# Patient Record
Sex: Female | Born: 1995 | Race: White | Hispanic: No | Marital: Single
Health system: Southern US, Community
[De-identification: ages and names within clinical notes are randomized; demographics above are authoritative.]

---
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
## 2015-04-27 IMAGING — CR DG THORACOLUMBAR SPINE STANDING SCOLIOSIS
1 series · 3 of 3 positions shown · non-contrast
Comparison: None.

CLINICAL DATA: Scoliosis

EXAM:
THORACOLUMBAR SCOLIOSIS STUDY - STANDING VIEWS

[Series 1001: view not recorded · 0.40mm/px · 3 of 3 slices shown]
[im 1/3]
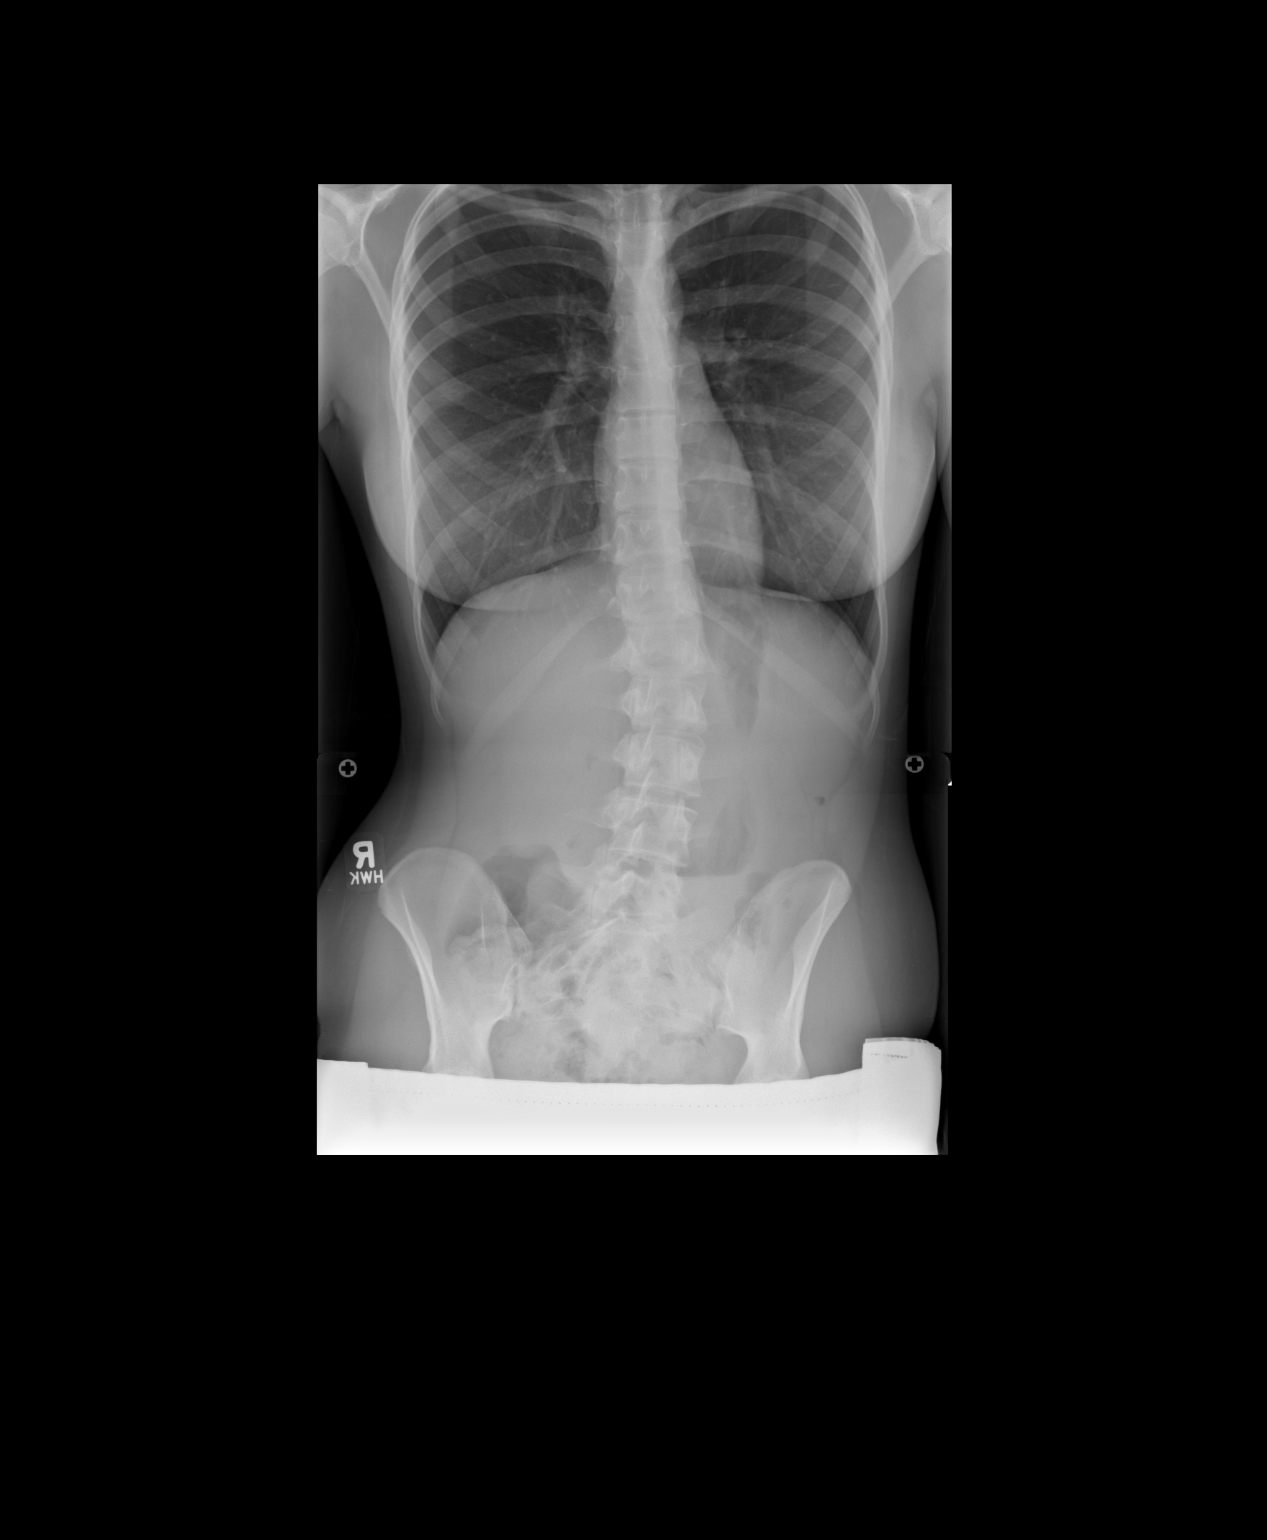
[im 2/3]
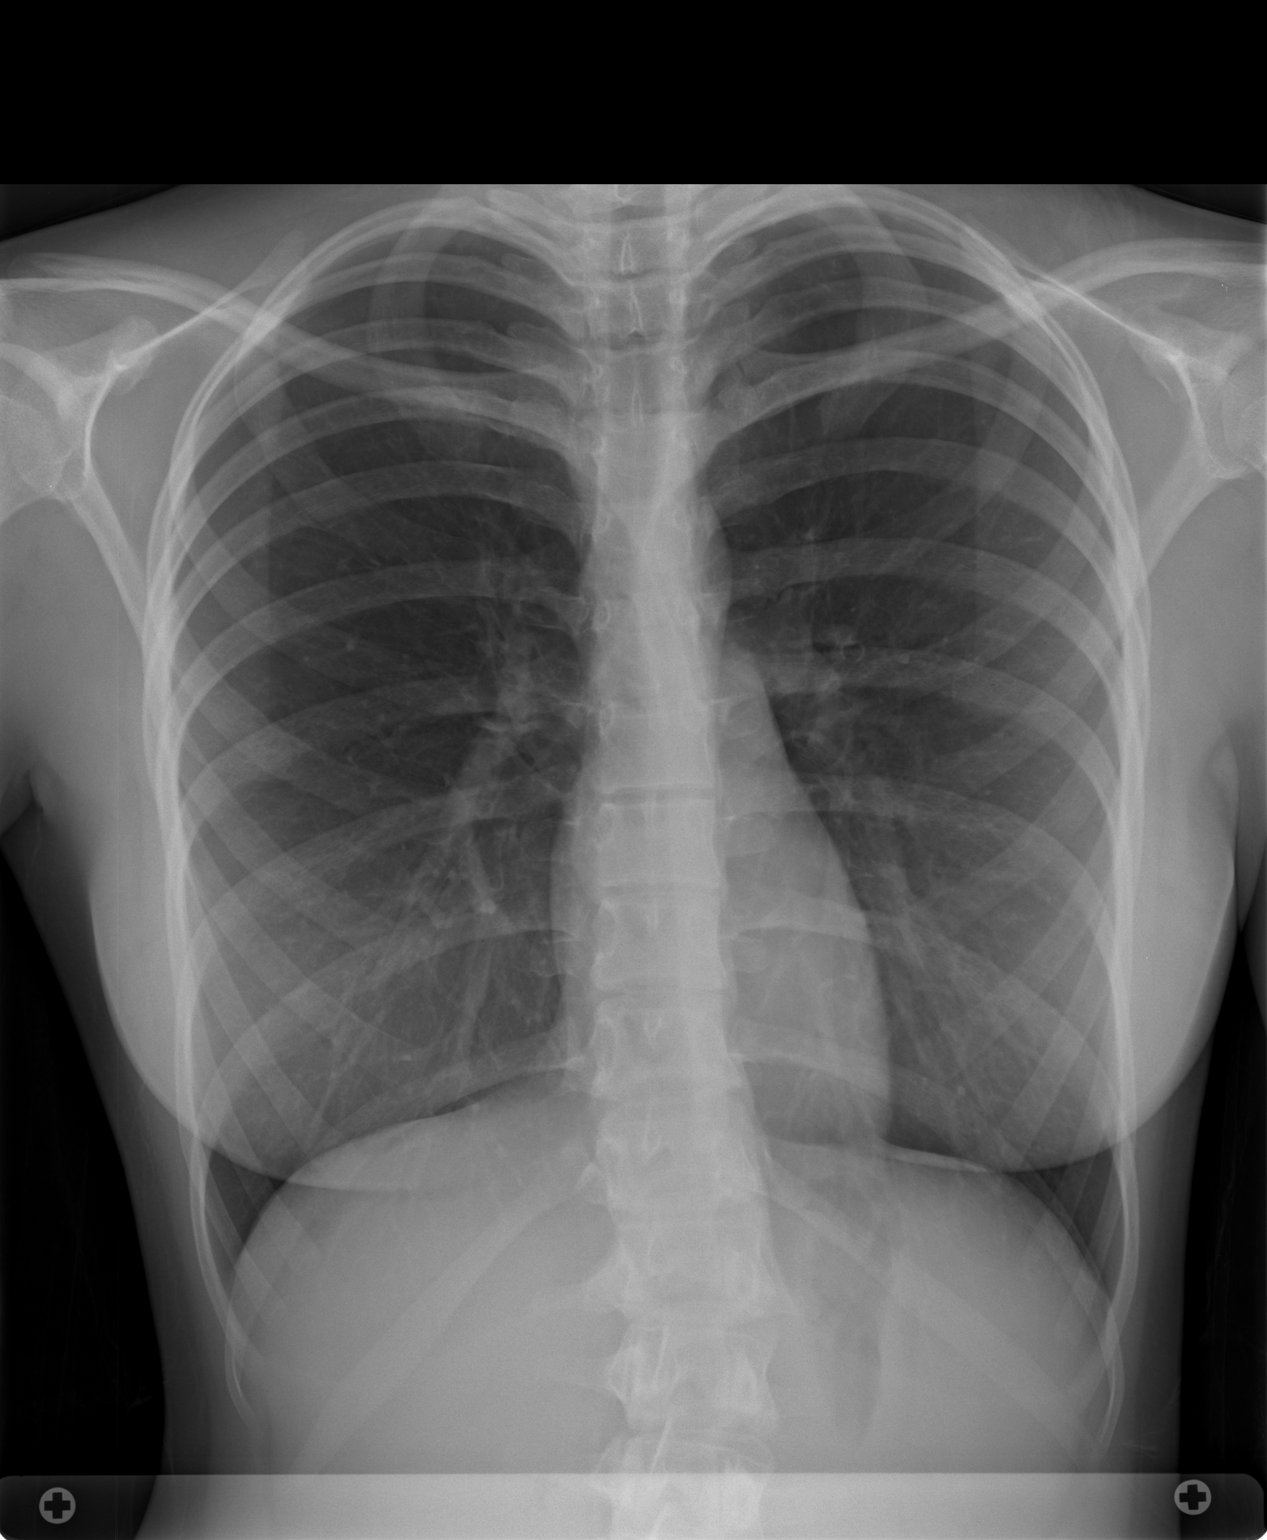
[im 3/3]
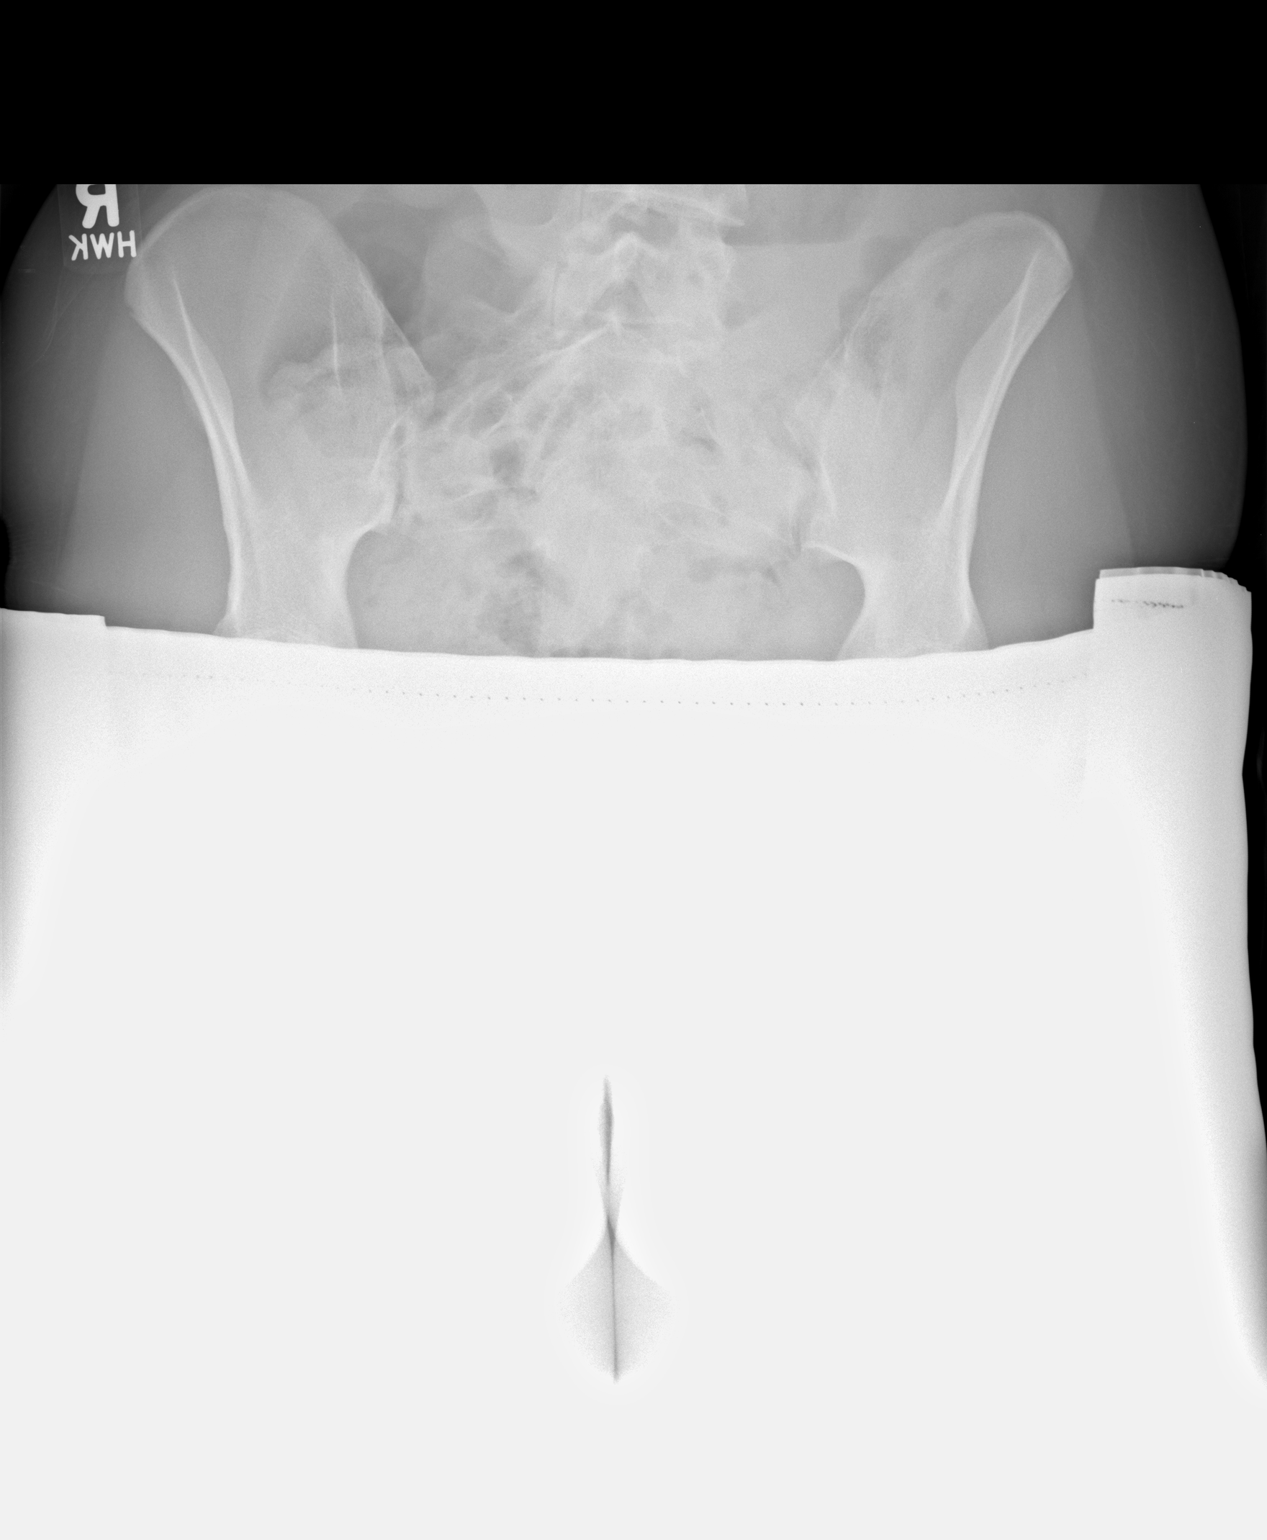

[3 of 3 positions shown; findings below may reference images not displayed]

FINDINGS: There is a lumbar scoliosis present convex to the left by
approximately 16 degrees. The thoracic vertebrae appear to be in
normal alignment.

The lungs are clear. The heart is within normal limits in size. The
bowel gas pattern is nonspecific.
IMPRESSION: Mild lumbar scoliosis convex left by 16 degrees.

## 2017-06-20 ENCOUNTER — Ambulatory Visit (INDEPENDENT_AMBULATORY_CARE_PROVIDER_SITE_OTHER): Payer: 59

## 2017-06-20 ENCOUNTER — Encounter (INDEPENDENT_AMBULATORY_CARE_PROVIDER_SITE_OTHER): Payer: Self-pay | Admitting: Orthopedic Surgery

## 2017-06-20 ENCOUNTER — Ambulatory Visit (INDEPENDENT_AMBULATORY_CARE_PROVIDER_SITE_OTHER): Payer: 59 | Admitting: Orthopedic Surgery

## 2017-06-20 DIAGNOSIS — M25561 Pain in right knee: Secondary | ICD-10-CM

## 2017-06-20 DIAGNOSIS — G8929 Other chronic pain: Secondary | ICD-10-CM | POA: Diagnosis not present

## 2017-06-20 DIAGNOSIS — M25562 Pain in left knee: Secondary | ICD-10-CM

## 2017-06-22 ENCOUNTER — Encounter (INDEPENDENT_AMBULATORY_CARE_PROVIDER_SITE_OTHER): Payer: Self-pay | Admitting: Orthopedic Surgery

## 2017-06-22 NOTE — Progress Notes (Signed)
Office Visit Note   Patient: Tara Christian           Date of Birth: 1996-01-22           MRN: [id number] Visit Date: 06/20/2017 Requested by: No referring provider defined for this encounter. PCP: Patient, No Pcp Per  Subjective: Chief Complaint  Patient presents with  . Right Knee - Pain  . Left Knee - Pain    HPI: Tara Christian is a patient with bilateral knee pain left worse than right.'s been going on for 3-4 years but it has been worse a fall in the kitchen at work 1 month ago.  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  Reports swelling popping but no locking or catching.  Localizes the pain primarily to the anterior aspect of the knee.  She does use a brace on the left-hand side.  She has been using Motrin and Aleve.  Stairs can be tough.  She has not done any rehab or exercises.              ROS: All systems reviewed are negative as they relate to the chief complaint within the history of present illness.  Patient denies  fevers or chills.   Assessment & Plan: Visit Diagnoses:  1. Chronic pain of both knees     Plan: Impression is chronic anterior knee pain and the patient has pretty normal exam normal alignment and normal radiographs.  I think she may have something aggravating that left knee and right knee in the retropatellar region but it is nothing surgical.  I would go with quad strengthening exercises and cortisone injections as needed if her symptoms become more severe.  Would not scan the knee until she is failed cortisone injections.  Follow-Up Instructions: Return if symptoms worsen or fail to improve.   Orders:  Orders Placed This Encounter  Procedures  . XR KNEE 3 VIEW RIGHT  . XR KNEE 3 VIEW LEFT   No orders of the defined types were placed in this encounter.     Procedures: No procedures performed   Clinical Data: No additional findings.  Objective: Vital Signs: There were no vitals taken for this visit.  Physical Exam:    Constitutional: Patient appears well-developed HEENT:  Head: Normocephalic Eyes:EOM are normal Neck: Normal range of motion Cardiovascular: Normal rate Pulmonary/chest: Effort normal Neurologic: Patient is alert Skin: Skin is warm Psychiatric: Patient has normal mood and affect    Ortho Exam: Orthopedic exam demonstrates full active and passive range of motion of both knees.  Negative patellar apprehension.  No increase in Q angle.  Has a little bit of periretinacular tenderness but no discrete tenderness at the inferior pole of the patella or the distal aspect of the quad tendon.  Collateral and cruciate ligaments are stable bilaterally.  No real patellofemoral crepitus with range of motion.  Negative apprehension.  No other masses lymphadenopathy or skin changes noted in the bilateral knee region.  Specialty Comments:  No specialty comments available.  Imaging: No results found.   PMFS History: There are no active problems to display for this patient.  History reviewed. No pertinent past medical history.  History reviewed. No pertinent family history.  History reviewed. No pertinent surgical history. Social History   Occupational History  . Not on file  Tobacco Use  . Smoking status: Not on file  Substance and Sexual Activity  . Alcohol use: Not on file  . Drug use: Not on  file  . Sexual activity: Not on file

## 2018-12-08 ENCOUNTER — Emergency Department (HOSPITAL_COMMUNITY): Payer: BC Managed Care – PPO

## 2018-12-08 ENCOUNTER — Emergency Department (HOSPITAL_COMMUNITY)
Admission: EM | Admit: 2018-12-08 | Discharge: 2018-12-08 | Disposition: A | Discharge status: Home / Self Care | Payer: BC Managed Care – PPO | Attending: Emergency Medicine | Admitting: Emergency Medicine

## 2018-12-08 ENCOUNTER — Encounter (HOSPITAL_COMMUNITY): Payer: Self-pay | Admitting: Emergency Medicine

## 2018-12-08 ENCOUNTER — Other Ambulatory Visit: Payer: Self-pay

## 2018-12-08 DIAGNOSIS — S61512A Laceration without foreign body of left wrist, initial encounter: Secondary | ICD-10-CM | POA: Insufficient documentation

## 2018-12-08 DIAGNOSIS — Y929 Unspecified place or not applicable: Secondary | ICD-10-CM | POA: Diagnosis not present

## 2018-12-08 DIAGNOSIS — W010XXA Fall on same level from slipping, tripping and stumbling without subsequent striking against object, initial encounter: Secondary | ICD-10-CM | POA: Insufficient documentation

## 2018-12-08 DIAGNOSIS — Z23 Encounter for immunization: Secondary | ICD-10-CM | POA: Insufficient documentation

## 2018-12-08 DIAGNOSIS — Y999 Unspecified external cause status: Secondary | ICD-10-CM | POA: Insufficient documentation

## 2018-12-08 DIAGNOSIS — S80211A Abrasion, right knee, initial encounter: Secondary | ICD-10-CM | POA: Diagnosis not present

## 2018-12-08 DIAGNOSIS — Y939 Activity, unspecified: Secondary | ICD-10-CM | POA: Insufficient documentation

## 2018-12-08 MED ORDER — LIDOCAINE-EPINEPHRINE (PF) 2 %-1:200000 IJ SOLN
10.0000 mL | Freq: Once | INTRAMUSCULAR | Status: AC
  Administered 2018-12-08: 10 mL
  Filled 2018-12-08: qty 20

## 2018-12-08 MED ORDER — TETANUS-DIPHTH-ACELL PERTUSSIS 5-2.5-18.5 LF-MCG/0.5 IM SUSP
0.5000 mL | Freq: Once | INTRAMUSCULAR | Status: AC
  Administered 2018-12-08: 0.5 mL via INTRAMUSCULAR
  Filled 2018-12-08: qty 0.5

## 2018-12-08 MED ORDER — OXYCODONE HCL 5 MG PO TABS
10.0000 mg | ORAL_TABLET | Freq: Once | ORAL | Status: AC
  Administered 2018-12-08: 07:00:00 10 mg via ORAL
  Filled 2018-12-08: qty 2

## 2018-12-08 MED ORDER — LIDOCAINE-EPINEPHRINE 2 %-1:100000 IJ SOLN: 20.0000 mL | Freq: Once | INTRAMUSCULAR | Status: DC

## 2018-12-08 NOTE — ED Notes (Signed)
Wound cleaned and redressed by Tonny Bollman and Judson Roch, EMT
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
## 2018-12-08 NOTE — ED Notes (Signed)
Pt crying and upset about the pain in her knee. Offered to xray knee but pt declined and states that she does not need an xray. Two abrasions noted to right knee, to abrasions cleaned with normal saline, covered with xeroform, Telfa and then Coban. Pt was appreciative but still very tearful.
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
## 2018-12-08 NOTE — Discharge Instructions (Addendum)
1. Medications: Alternate 600 mg of ibuprofen and 500-1000 mg of Tylenol every 3 hours as needed for pain. Do not exceed 4000 mg of Tylenol daily.  Take ibuprofen with food to avoid upset stomach issues. 2. Treatment: ice for swelling, keep wound clean with warm soap and water and keep bandage dry, do not submerge in water for 24 hours 3. Follow Up: Please return in 7 days to have your stitches/staples removed or sooner if you have concerns.  You may also go to urgent care or your primary care physician.  Return to the emergency department sooner if any concerning signs or symptoms develop such as high fevers, redness, drainage of pus from the wound, or swelling.   WOUND CARE  Keep area clean and dry for 24 hours. Do not remove bandage, if applied.  After 24 hours, remove bandage and wash wound gently with mild soap and warm water. Reapply a new bandage after cleaning wound, if directed.   Continue daily cleansing with soap and water until stitches/staples are removed.  Do not apply any ointments or creams to the wound while stitches/staples are in place, as this may cause delayed healing. Return if you experience any of the following signs of infection: Swelling, redness, pus drainage, streaking, fever >101.0 F  Return if you experience excessive bleeding that does not stop after 15-20 minutes of constant, firm pressure.  
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
## 2020-08-11 IMAGING — CR LEFT HAND - COMPLETE 3+ VIEW
3 series · 3 of 3 positions shown · non-contrast
Comparison: None.

CLINICAL DATA: Laceration

EXAM:
LEFT HAND - COMPLETE 3+ VIEW

[hand pa]
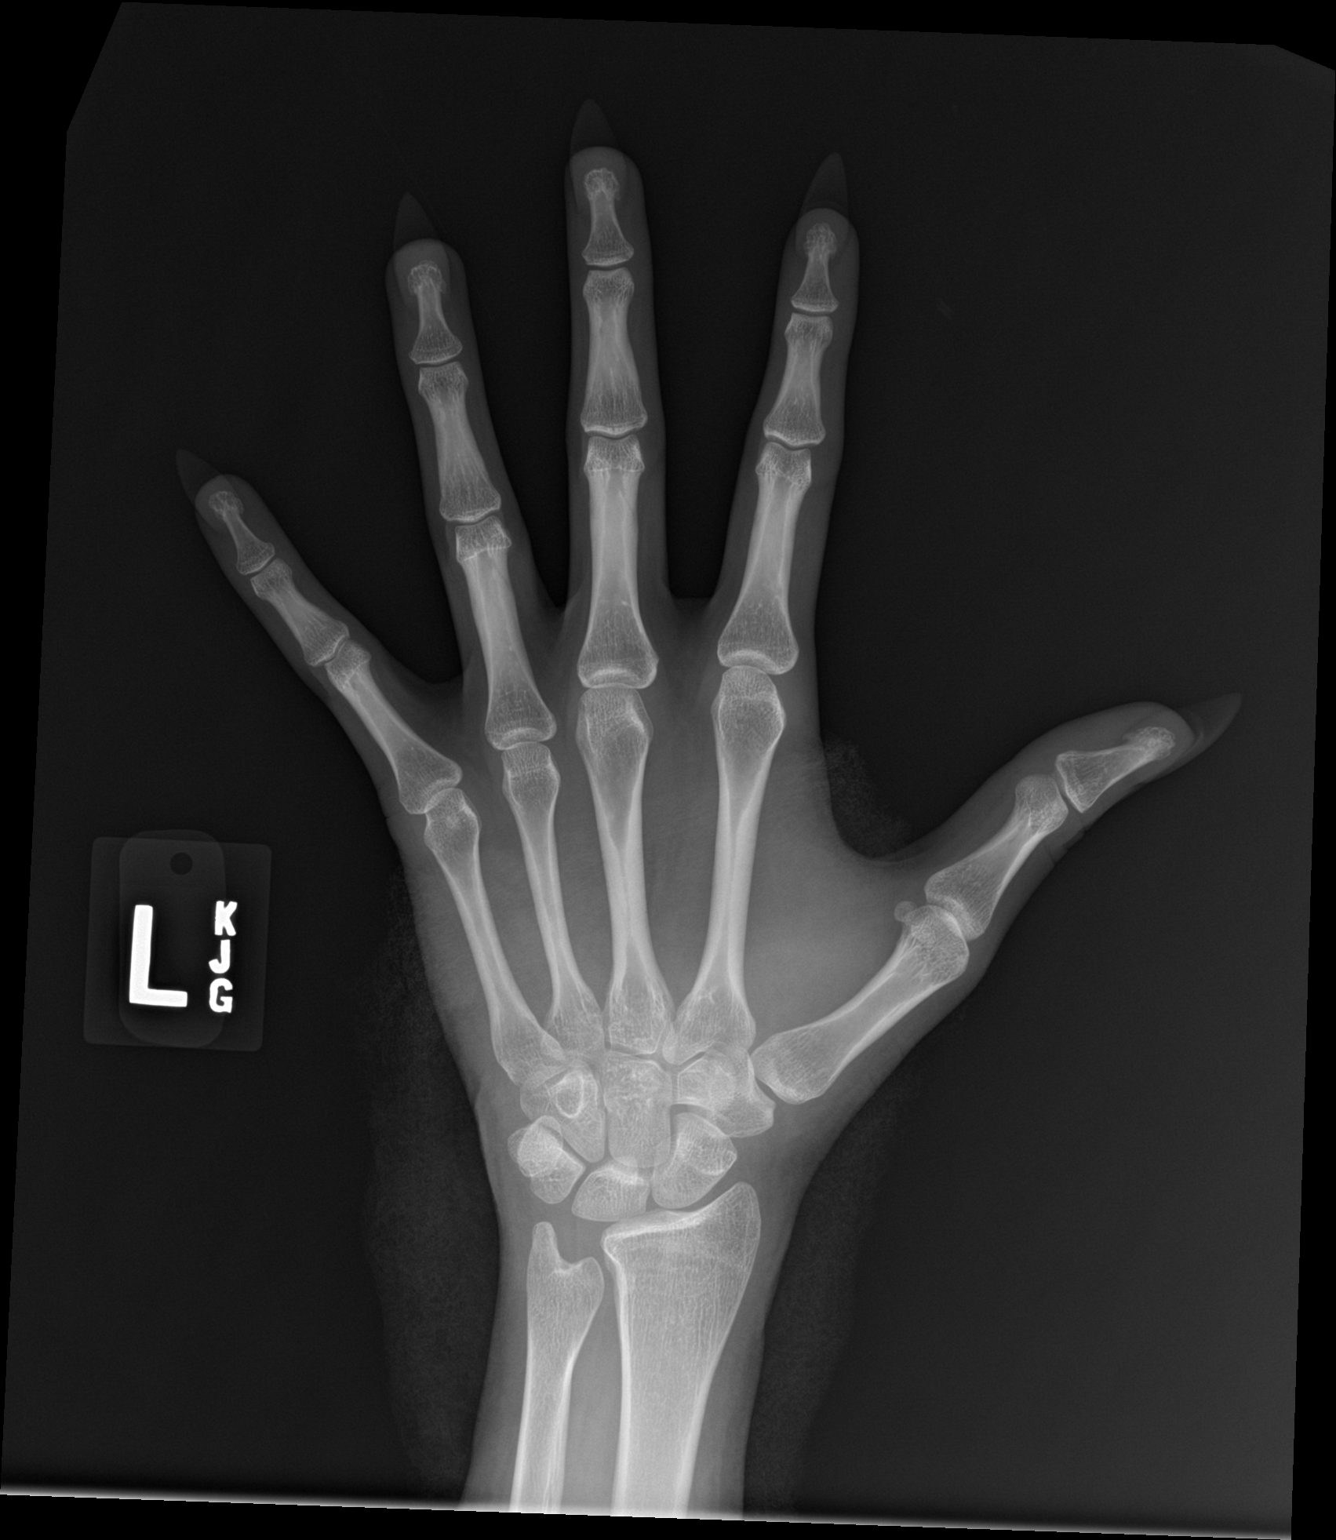

[hand obl]
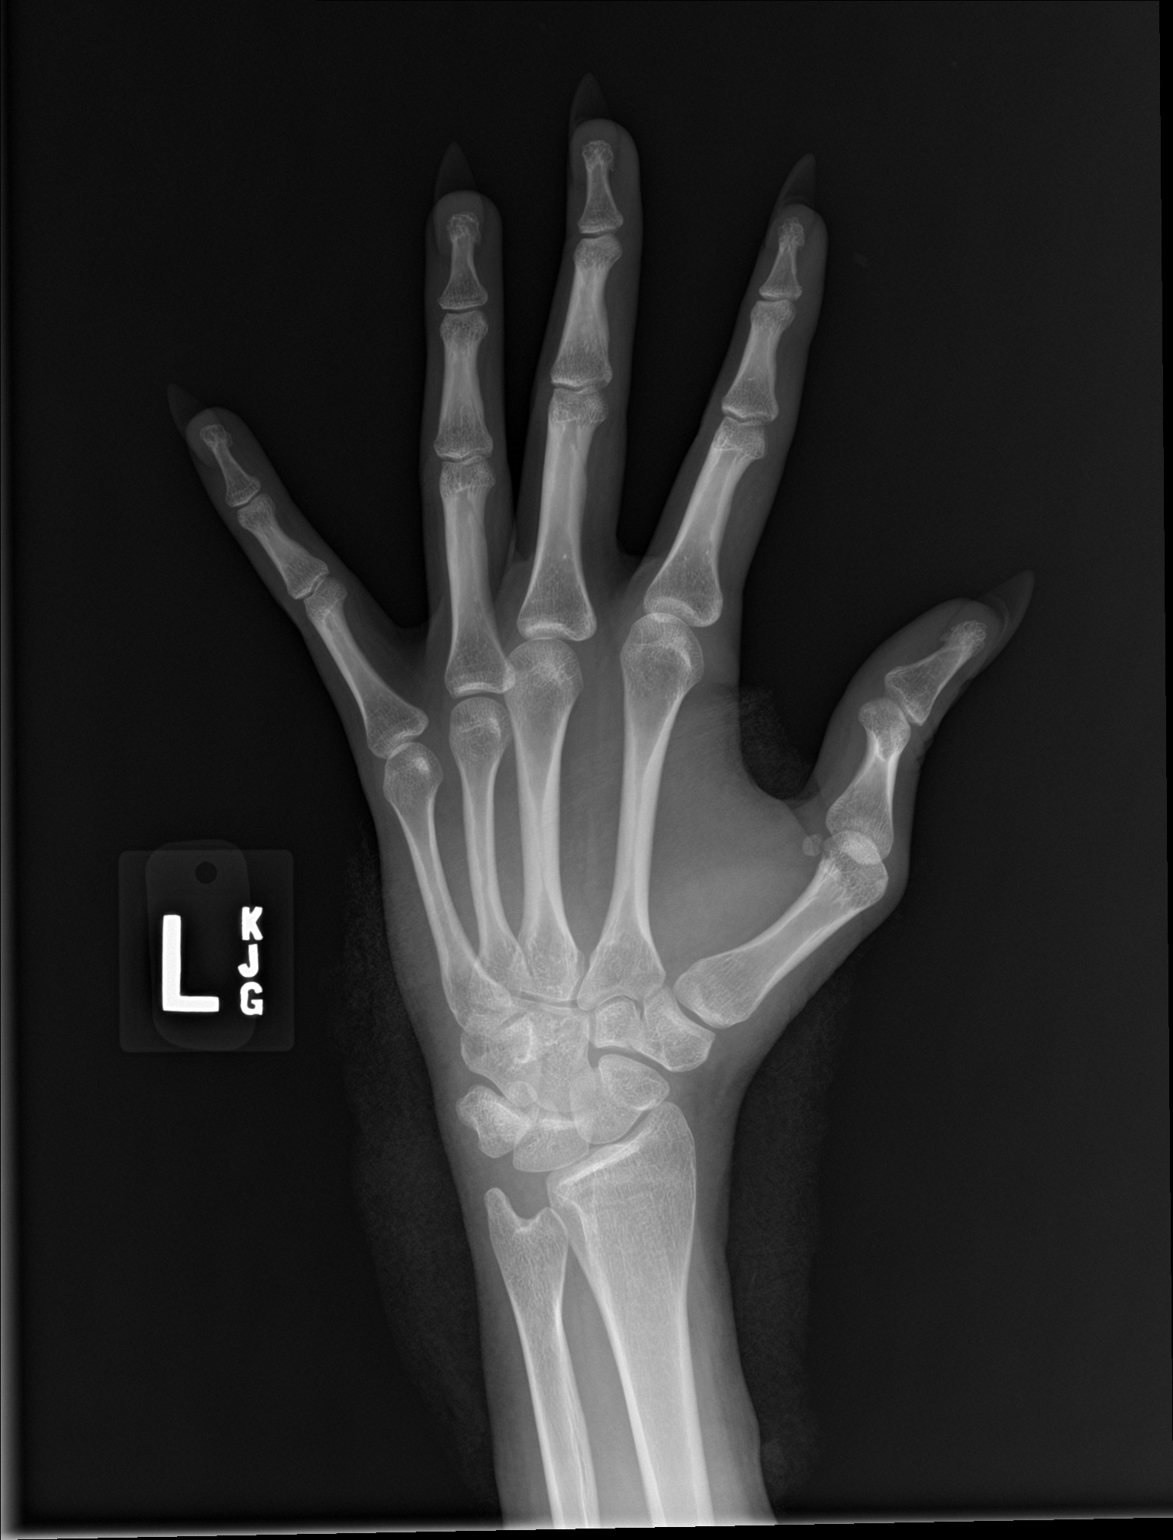

[hand lat]
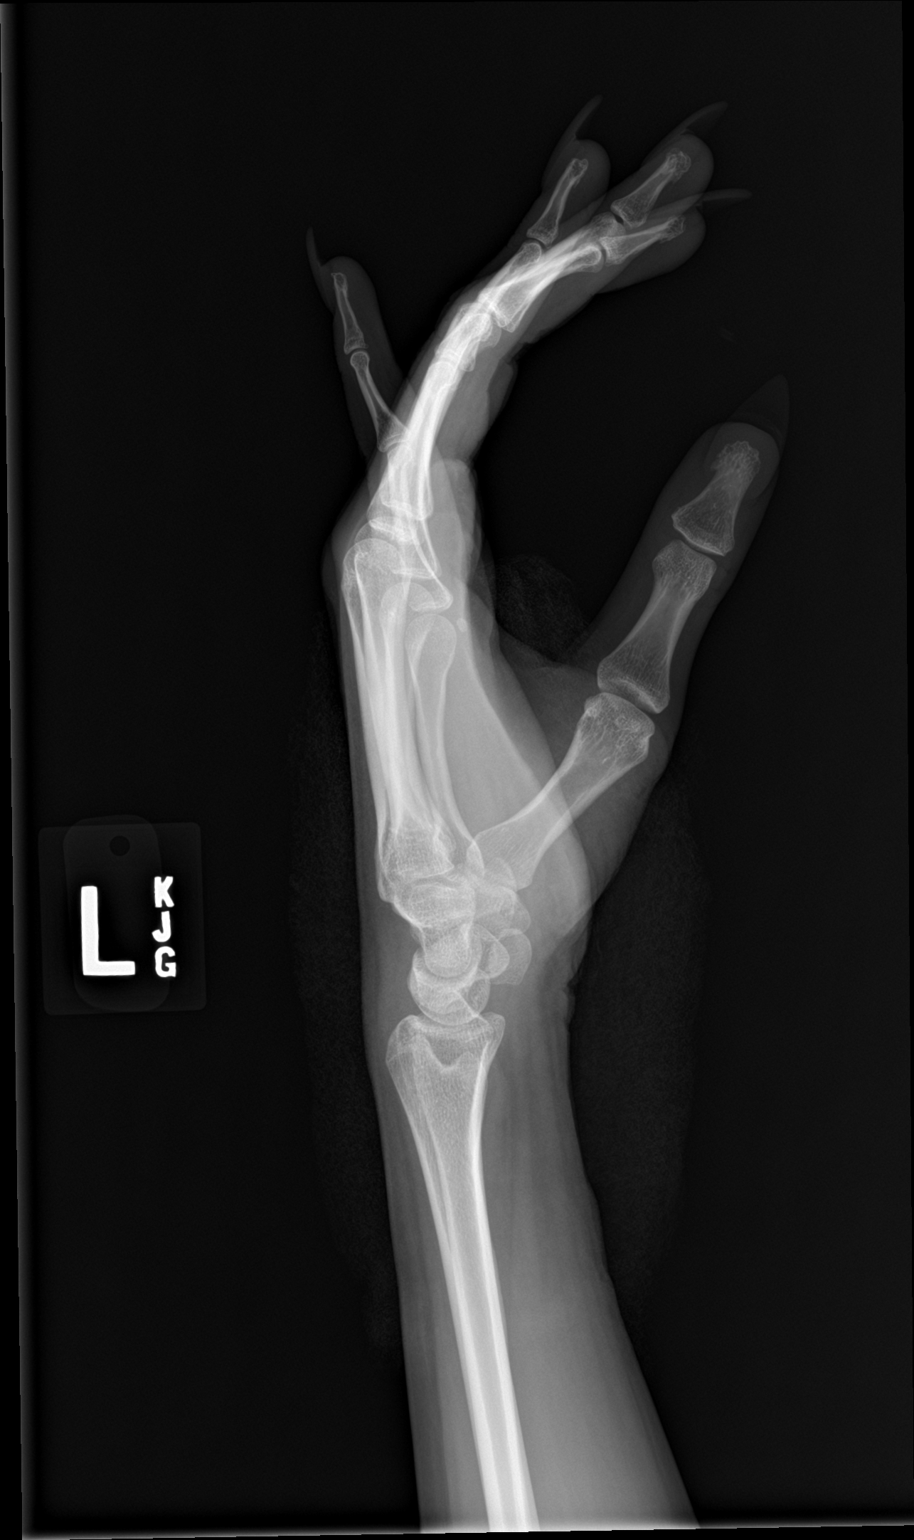

[3 of 3 positions shown; findings below may reference images not displayed]

FINDINGS: There is no evidence of fracture or dislocation. There is no
evidence of arthropathy or other focal bone abnormality. Soft
tissues are unremarkable.
IMPRESSION: Negative.
